# Patient Record
Sex: Female | Born: 1990 | Race: Black or African American | Hispanic: No | Marital: Single | Smoking: Light tobacco smoker
Health system: Southern US, Community
[De-identification: ages and names within clinical notes are randomized; demographics above are authoritative.]

---
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
## 2011-01-06 ENCOUNTER — Inpatient Hospital Stay (INDEPENDENT_AMBULATORY_CARE_PROVIDER_SITE_OTHER)
Admission: RE | Admit: 2011-01-06 | Discharge: 2011-01-06 | Disposition: A | Discharge status: Home / Self Care | Payer: Self-pay | Source: Ambulatory Visit | Attending: Emergency Medicine | Admitting: Emergency Medicine

## 2011-01-06 ENCOUNTER — Ambulatory Visit (INDEPENDENT_AMBULATORY_CARE_PROVIDER_SITE_OTHER): Payer: Self-pay

## 2011-01-06 DIAGNOSIS — S139XXA Sprain of joints and ligaments of unspecified parts of neck, initial encounter: Secondary | ICD-10-CM

## 2012-03-01 ENCOUNTER — Emergency Department: Payer: Self-pay | Admitting: *Deleted

## 2012-03-01 LAB — URINALYSIS, COMPLETE
Bacteria: NONE SEEN
Blood: NEGATIVE
Ketone: NEGATIVE
Protein: NEGATIVE
Specific Gravity: 1.01 (ref 1.003–1.030)
Squamous Epithelial: 1
WBC UR: 10 /HPF (ref 0–5)

## 2012-03-01 LAB — PREGNANCY, URINE: Pregnancy Test, Urine: NEGATIVE m[IU]/mL

## 2012-03-07 ENCOUNTER — Inpatient Hospital Stay: Payer: Self-pay | Admitting: Obstetrics & Gynecology

## 2012-03-07 LAB — URINALYSIS, COMPLETE
Blood: NEGATIVE
Ketone: NEGATIVE
Ph: 5 (ref 4.5–8.0)
Ph: 6 (ref 4.5–8.0)
Protein: NEGATIVE
Protein: NEGATIVE
RBC,UR: 1 /HPF (ref 0–5)

## 2012-03-07 LAB — CBC
HCT: 40.1 % (ref 35.0–47.0)
HGB: 13.4 g/dL (ref 12.0–16.0)
MCH: 31.8 pg (ref 26.0–34.0)
MCV: 95 fL (ref 80–100)
RBC: 4.21 10*6/uL (ref 3.80–5.20)

## 2012-03-07 LAB — COMPREHENSIVE METABOLIC PANEL
Albumin: 4.4 g/dL (ref 3.4–5.0)
Alkaline Phosphatase: 91 U/L (ref 50–136)
BUN: 11 mg/dL (ref 7–18)
Glucose: 90 mg/dL (ref 65–99)
Potassium: 3.8 mmol/L (ref 3.5–5.1)
SGOT(AST): 26 U/L (ref 15–37)
SGPT (ALT): 27 U/L (ref 12–78)
Total Protein: 10.2 g/dL — ABNORMAL HIGH (ref 6.4–8.2)

## 2012-03-07 LAB — WET PREP, GENITAL

## 2012-03-08 LAB — CBC WITH DIFFERENTIAL/PLATELET
Basophil %: 0.3 %
Comment - H1-Com1: NORMAL
Eosinophil %: 0.2 %
HCT: 31 % — ABNORMAL LOW (ref 35.0–47.0)
HGB: 10.7 g/dL — ABNORMAL LOW (ref 12.0–16.0)
Lymphocyte %: 9 %
Lymphocytes: 12 %
MCV: 95 fL (ref 80–100)
Monocytes: 2 %
Platelet: 403 10*3/uL (ref 150–440)
RBC: 3.26 10*6/uL — ABNORMAL LOW (ref 3.80–5.20)
WBC: 23.7 10*3/uL — ABNORMAL HIGH (ref 3.6–11.0)

## 2012-03-09 LAB — CBC WITH DIFFERENTIAL/PLATELET
Bands: 3 %
Basophil #: 0 10*3/uL (ref 0.0–0.1)
Eosinophil #: 0.1 10*3/uL (ref 0.0–0.7)
HCT: 30.8 % — ABNORMAL LOW (ref 35.0–47.0)
HGB: 10.1 g/dL — ABNORMAL LOW (ref 12.0–16.0)
Lymphocyte %: 8.1 %
MCV: 96 fL (ref 80–100)
Monocyte #: 1.3 x10 3/mm — ABNORMAL HIGH (ref 0.2–0.9)
Segmented Neutrophils: 82 %
WBC: 23.8 10*3/uL — ABNORMAL HIGH (ref 3.6–11.0)

## 2012-03-09 LAB — CBC
HGB: 10.8 g/dL — ABNORMAL LOW (ref 12.0–16.0)
Platelet: 443 10*3/uL — ABNORMAL HIGH (ref 150–440)

## 2012-03-10 LAB — COMPREHENSIVE METABOLIC PANEL
BUN: 5 mg/dL — ABNORMAL LOW (ref 7–18)
Chloride: 109 mmol/L — ABNORMAL HIGH (ref 98–107)
Co2: 24 mmol/L (ref 21–32)
Creatinine: 0.73 mg/dL (ref 0.60–1.30)
EGFR (Non-African Amer.): >60
Potassium: 3.4 mmol/L — ABNORMAL LOW (ref 3.5–5.1)
SGPT (ALT): 23 U/L (ref 12–78)

## 2012-03-10 LAB — CBC WITH DIFFERENTIAL/PLATELET
Basophil #: 0.1 10*3/uL (ref 0.0–0.1)
Lymphocyte #: 2.4 10*3/uL (ref 1.0–3.6)
MCV: 96 fL (ref 80–100)
Monocyte %: 4.6 %
Neutrophil %: 83.8 %
Platelet: 416 10*3/uL (ref 150–440)
RBC: 3.13 10*6/uL — ABNORMAL LOW (ref 3.80–5.20)
RDW: 12.5 % (ref 11.5–14.5)
WBC: 22.8 10*3/uL — ABNORMAL HIGH (ref 3.6–11.0)

## 2012-03-10 LAB — APTT: Activated PTT: 50 secs — ABNORMAL HIGH (ref 23.6–35.9)

## 2012-03-10 LAB — PROTIME-INR
INR: 1.1
Prothrombin Time: 14.3 secs (ref 11.5–14.7)

## 2012-03-13 LAB — CULTURE, BLOOD (SINGLE)

## 2014-09-01 NOTE — Consult Note (Signed)
Brief Consult Note: Diagnosis: TOA.   Patient was seen by consultant.   Consult note dictated.   Recommend further assessment or treatment.   Orders entered.   Discussed with Attending MD.   Comments: Will have surgery consult in the event that this represents appendicities as opposed to [facility].  Electronic Signatures: Lorrene ReidStaebler, Doryan Bahl M (MD)  (Signed 24-Oct-13 20:23)  Authored: Brief Consult Note   Last Updated: 24-Oct-13 20:23 by Lorrene ReidStaebler, Tayler Heiden M (MD)

## 2014-09-01 NOTE — Consult Note (Signed)
Consulting Department: Emergency Department Consulting Physician:  Primary Care: [facility]  Consulting Question: Abdominal pain, fever, leukocytosis, adnexal fluid collection  History of Present Illness: Patient is a 24 yo G0 presenting with a 1 week history of abdominal pain and lumbago.  Initially presented to the ER on 03/01/2012 at which time she was diagnosed with muscle strain.  Her symptoms have been gradually worsening.  She has not developed fevers and chills. There is no associated nausea, pain is started as diffuse pelvic pain but now also reports some right upper quadrant pain. No associated nausea or emesis, no constipation, no diarrhea.   Her LMP was 02/24/2012 and lasted 3 days.  She denies a history of sexually transmitted infections, does endorse unprotected intercourse a littel over a month ago.  Some non-purulent white vaginal discharge has been noted by the patient.    Review of Systems: 10 point review of systems negative unless otherwise noted in HPI  Past Medical History: none  Past Surgical History: none  Past Obstetric History: G0  Past Gynecologic History:  no history of STI  or abnormal paps.  Menarche age 24, monthly menses lasting 5-7 days  Family History: Mother with lupus, father with diabetes  Social History: Reports social EtOH use, no tobacco use, no illicit drug use  Allergies: NKDA  Medications: none  Physical Exam: T 101.1, BP 140/98, HR 128, RR 24, O2sat 97% RA General: Visibily is some discomfort HEENT: normocephalic, anicteric Cardivascular: tachycardic, no adventitious heart sounds Pulmonary: CTAB Abdomen: NABS, soft, non-distended, rebound and guarding present, significantly tenderness diffusely throughout abdomen Pelvic: Normal external female genitalia, no purulent vaginal discharge, normal appearing cervix.  Moderate cervical motion tenderness, no adnexal masses apprecaited but exam limited secondary to patient  discomfort. Extremities: No edema Neurologic: grossly intact  Labs: UA: negative Wet Mount:  Large number of leukocytes otherwise negative WBC: 28.8K, H&H 13.4 & 40.1, Platelets 541K Lipase 111 CMP: no abnormalities UPT negative GC/CT and blood cultures pending CT scan loculated fluid collection in the pelvis  Assessment: 24 yo G0 with tubo-ovarian abscess  Plan: - Admit to gynecology service - Appendix not visualized on CT scan but appearance consistent with TOA, surgery following - Start cerftriaxone and and doxycyline - Morphine PCA for pain controll - Repeat CBC tomorrow AM - Follow up on GC/CT cultures  Vena AustriaAndreas Uziel Covault, MD  Electronic Signatures: Lorrene ReidStaebler, Aleenah Homen M (MD)  (Signed on 25-Oct-13 00:58)  Authored  Last Updated: 25-Oct-13 00:58 by Lorrene ReidStaebler, Leanthony Rhett M (MD)

## 2014-09-01 NOTE — Consult Note (Signed)
PATIENT NAME:  Grace Boyd, Grace Boyd MR#:  [id number] DATE OF BIRTH:  [birth year]  DATE OF CONSULTATION:  03/07/2012  REFERRING PHYSICIAN:   CONSULTING PHYSICIAN:  Adah Salvageichard E. Excell Seltzerooper, MD  CHIEF COMPLAINT: Lower abdominal pain.   HISTORY OF PRESENT ILLNESS: This is a patient with approximately two weeks of abdominal pain. It actually started in the left lower quadrant just as she was finishing her menstrual cycle and has gradually worsened, somewhat waxed and waned through the week but in the last few days it'Boyd worsened. She started having low-grade fevers. She has been to the ER once and to Urgent Care as well. She has had some nausea. No vomiting. No chills but she has had low-grade fevers. Has had normal bowel movements. No melena or hematochezia. Her chief complaint was actually that of left lower quadrant pain at the time of the first onset of symptoms.   I was asked to see the patient for a pelvic abscess and she is being admitted by the gynecology service. I spoke directly with Dr. Darnelle CatalanMalinda concerning her care.   PAST MEDICAL HISTORY: None.   PAST SURGICAL HISTORY: None.   ALLERGIES: None.   MEDICATIONS: None.   FAMILY HISTORY: Noncontributory.   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 She stopped smoking three months ago. Drinks alcohol on weekends. She has rare but multiple sexual partners. Most recent sexual contact was approximately four weeks ago, two weeks prior to the onset of these symptoms.   PHYSICAL EXAMINATION:   GENERAL: Healthy appearing black female patient.   VITAL SIGNS: Temperature 99.3, was 101.1, pulse 102, respirations 18, blood pressure 127/70, 99% room air sat. Pain scale 6.   GENERAL: The patient has her legs drawn up into a semi Fowler-type position.   HEENT: No scleral icterus.   NECK: No palpable neck nodes.   CHEST: Clear to auscultation.   CARDIAC: Regular rate and rhythm.   ABDOMEN: Abdomen is showing guarding in both lower  quadrants with percussion tenderness and rebound tenderness. No mass palpable. More tender in the left lower quadrant than right lower quadrant.   EXTREMITIES: Without edema. Calves are nontender.   NEUROLOGIC: Grossly intact.   INTEGUMENTARY: No jaundice.   LABORATORY, DIAGNOSTIC, AND RADIOLOGICAL DATA: White blood cell count 28.8, platelet count 541. Electrolytes are within normal limits.   CT scan is personally reviewed demonstrating a pelvic abscess. The appendix is not identified but there is no stranding or inflammatory process suggested in the pericecal area.   ASSESSMENT AND PLAN: Doubt that this is appendicitis, fairly classic presentation for PID and TOA. I suspect that that is the etiology of her pain and this pelvic abscess.   Management of the pelvic abscess will be left up to the GYN service whether it is drained percutaneously or laparoscopically and I would certainly offer for either myself or Dr. Egbert GaribaldiBird to be available if and when she were to go to the operating room as an assistant if necessary.    If this turns out to be something other than TOA or PID such as appendicitis, we would certainly be happy to be available and will follow the patient while she is in the hospital. She has already been started on antibiotics. I discussed this case with Dr. Darnelle CatalanMalinda.   ____________________________ Adah Salvageichard E. Excell Seltzerooper, MD rec:drc D: 03/07/2012 21:05:34 ET T: 03/08/2012 09:09:29 ET JOB#: [id number]  cc: Adah Salvageichard E. Excell Seltzerooper, MD, <Dictator> Lattie HawICHARD E Joseff Luckman MD ELECTRONICALLY SIGNED 03/15/2012 19:47

## 2014-09-01 NOTE — Consult Note (Signed)
Brief Consult Note: Diagnosis: PID/TOA.   Patient was seen by consultant.   Consult note dictated.   Recommend further assessment or treatment.   Discussed with Attending MD.   Comments: Likely PID/TOA. will be available to assist with care if drainage or laparoscopy is indicated by GYN admitting team. discussed with Dr Darnelle CatalanMalinda.  Electronic Signatures: Lattie Hawooper, Oberon Hehir E (MD)  (Signed 24-Oct-13 20:54)  Authored: Brief Consult Note   Last Updated: 24-Oct-13 20:54 by Lattie Hawooper, Nimesh Riolo E (MD)

## 2014-12-18 ENCOUNTER — Encounter: Payer: Self-pay | Admitting: Gynecology

## 2014-12-18 ENCOUNTER — Ambulatory Visit: Payer: Worker's Compensation

## 2014-12-18 ENCOUNTER — Ambulatory Visit
Admission: EM | Admit: 2014-12-18 | Discharge: 2014-12-18 | Disposition: A | Discharge status: Home / Self Care | Payer: Worker's Compensation | Attending: Family Medicine | Admitting: Family Medicine

## 2014-12-18 ENCOUNTER — Telehealth: Payer: Self-pay

## 2014-12-18 DIAGNOSIS — Z79899 Other long term (current) drug therapy: Secondary | ICD-10-CM | POA: Insufficient documentation

## 2014-12-18 DIAGNOSIS — F17210 Nicotine dependence, cigarettes, uncomplicated: Secondary | ICD-10-CM | POA: Diagnosis not present

## 2014-12-18 DIAGNOSIS — X58XXXA Exposure to other specified factors, initial encounter: Secondary | ICD-10-CM | POA: Insufficient documentation

## 2014-12-18 DIAGNOSIS — S63501A Unspecified sprain of right wrist, initial encounter: Secondary | ICD-10-CM | POA: Diagnosis not present

## 2014-12-18 DIAGNOSIS — S6991XA Unspecified injury of right wrist, hand and finger(s), initial encounter: Secondary | ICD-10-CM | POA: Diagnosis present

## 2014-12-18 MED ORDER — HYDROCODONE-ACETAMINOPHEN 5-325 MG PO TABS: 1.0000 | ORAL_TABLET | Freq: Four times a day (QID) | ORAL | Status: AC | PRN

## 2014-12-18 NOTE — ED Provider Notes (Signed)
CSN: [id number]     Arrival date & time 12/18/14  1723 History   First MD Initiated Contact with Patient 12/18/14 1827     Chief Complaint  Patient presents with  . Wrist Injury   (Consider location/radiation/quality/duration/timing/severity/associated sxs/prior Treatment) HPI Comments: 24 yo female with a complaint of right wrist pain after injury at work yesterday around 5pm.  States was lifting a box with her right hand when she felt sudden onset of pain. Pain has continued and has also noticed some mild swelling.  Denies any numbness or tingling.   The history is provided by the patient.    History reviewed. No pertinent past medical history. History reviewed. No pertinent past surgical history. No family history on file. History  Substance Use Topics  . Smoking status: Light Tobacco Smoker  . Smokeless tobacco: Not on file  . Alcohol Use: Yes   OB History    No data available     Review of Systems  Allergies  Review of patient's allergies indicates no known allergies.  Home Medications   Prior to Admission medications   Medication Sig Start Date End Date Taking? Authorizing Provider  HYDROcodone-acetaminophen (NORCO/VICODIN) 5-325 MG per tablet Take 1-2 tablets by mouth every 6 (six) hours as needed. 12/18/14   Payton Mccallum, MD   BP 124/83 mmHg  Pulse 74  Temp(Src) 98 F (36.7 C) (Oral)  Resp 18  Ht  (1.702 m)  Wt 182 lb (82.555 kg)  BMI 28.50 kg/m2  SpO2 100%  LMP 11/17/2014 (Approximate) Physical Exam  Constitutional: She appears well-developed and well-nourished. No distress.  Musculoskeletal:       Right wrist: She exhibits decreased range of motion (secondary to pain), tenderness, bony tenderness and swelling (mild). She exhibits no effusion, no crepitus, no deformity and no laceration.  Skin: Skin is warm and dry. She is not diaphoretic.  Nursing note and vitals reviewed.   ED Course  Procedures (including critical care time) Labs Review Labs  Reviewed - No data to display  Imaging Review Dg Wrist Complete Right  12/18/2014   CLINICAL DATA:  Lifted a heavy box at work yesterday and injured wrist.  EXAM: RIGHT WRIST - COMPLETE 3+ VIEW  COMPARISON:  None.  FINDINGS: The joint spaces are maintained. No acute bony findings or abnormal soft tissue calcifications.  IMPRESSION: Normal right wrist radiographs.   Electronically Signed   By: Rudie Meyer M.D.   On: 12/18/2014 18:52     MDM   1. Right wrist sprain, initial encounter    Discharge Medication List as of 12/18/2014  7:20 PM    START taking these medications   Details  HYDROcodone-acetaminophen (NORCO/VICODIN) 5-325 MG per tablet Take 1-2 tablets by mouth every 6 (six) hours as needed., Starting 12/18/2014, Until Discontinued, Print      Plan: 1. x-ray results and diagnosis reviewed with patient 2. rx as per orders; risks, benefits, potential side effects reviewed with patient 3. Recommend supportive treatment with rest, ice, work restrictions as per work note 4. Patient given velcro wrist splint in clinic 5. F/u in 1 week or prn if symptoms worsen or don't improve    Payton Mccallum, MD 12/18/14 [phone]

## 2014-12-18 NOTE — ED Notes (Signed)
Call to patient, left message regarding prescription for Hydrocodone was left behind. Ask that she come back by and get prescription as we can not call it in. Prescription locked in [facility].

## 2014-12-18 NOTE — ED Notes (Signed)
Patient c/o right wrist injury at work yesterday at work. Per patient lifting a box with one hand.

## 2015-01-06 ENCOUNTER — Ambulatory Visit
Admission: EM | Admit: 2015-01-06 | Discharge: 2015-01-06 | Disposition: A | Discharge status: Home / Self Care | Payer: Worker's Compensation | Attending: Family Medicine | Admitting: Family Medicine

## 2015-01-06 DIAGNOSIS — S63501D Unspecified sprain of right wrist, subsequent encounter: Secondary | ICD-10-CM | POA: Diagnosis not present

## 2015-01-06 NOTE — ED Provider Notes (Signed)
CSN: [id number]     Arrival date & time 01/06/15  1711 History   First MD Initiated Contact with Patient 01/06/15 1753     Chief Complaint  Patient presents with  . Follow-up   (Consider location/radiation/quality/duration/timing/severity/associated sxs/prior Treatment) HPI Comments: 24 yo female with a h/o a right wrist sprain work injury on 12/17/14 here for follow up. States she doing better and symptoms resolved. States she's ready to return to work full duty. Denies any symptoms.   The history is provided by the patient.    History reviewed. No pertinent past medical history. History reviewed. No pertinent past surgical history. Family History  Problem Relation Age of Onset  . Lupus Mother    Social History  Substance Use Topics  . Smoking status: Light Tobacco Smoker  . Smokeless tobacco: None  . Alcohol Use: Yes   OB History    No data available     Review of Systems  Allergies  Review of patient's allergies indicates no known allergies.  Home Medications   Prior to Admission medications   Medication Sig Start Date End Date Taking? Authorizing Provider  HYDROcodone-acetaminophen (NORCO/VICODIN) 5-325 MG per tablet Take 1-2 tablets by mouth every 6 (six) hours as needed. 12/18/14  Yes Payton Mccallum, MD   BP 136/93 mmHg  Pulse 78  Temp(Src) 98.1 F (36.7 C) (Oral)  Resp 16  Ht  (1.702 m)  Wt 180 lb (81.647 kg)  BMI 28.19 kg/m2  SpO2 100%  LMP 12/06/2014 (Exact Date) Physical Exam  Constitutional: She appears well-developed and well-nourished. No distress.  Musculoskeletal: She exhibits no edema or tenderness.       Right wrist: Normal.  Skin: She is not diaphoretic.  Nursing note and vitals reviewed.   ED Course  Procedures (including critical care time) Labs Review Labs Reviewed - No data to display  Imaging Review No results found.   MDM   1. Wrist sprain, right, subsequent encounter    Plan: 1. diagnosis reviewed with patient 2.  Patient's condition resolved; patient medically stationary; release to return to work full duty, without restrictions 3. F/u prn     Payton Mccallum, MD 01/06/15 [phone]

## 2015-01-06 NOTE — ED Notes (Signed)
Injured right wrist at work at [employer] on 12/17/14. Seen here on 12/17/13. Given wrist splint, letter for "light duty" and instructed to return August 12/16. States was waiting for HR to call her, and didn't receive call until August 19th. Took the following week off to rest the wrist. Have 2nd job that required use of wrist and occasionally had to remove the splint for comfort. Informed on August 19th by HR at [place], that needed a note by the end of this current week to return to work at [employer].

## 2015-05-18 ENCOUNTER — Emergency Department
Admission: EM | Admit: 2015-05-18 | Discharge: 2015-05-18 | Disposition: A | Discharge status: Home / Self Care | Payer: Self-pay | Attending: Emergency Medicine | Admitting: Emergency Medicine

## 2015-05-18 DIAGNOSIS — F17200 Nicotine dependence, unspecified, uncomplicated: Secondary | ICD-10-CM | POA: Insufficient documentation

## 2015-05-18 DIAGNOSIS — K0381 Cracked tooth: Secondary | ICD-10-CM | POA: Insufficient documentation

## 2015-05-18 DIAGNOSIS — R030 Elevated blood-pressure reading, without diagnosis of hypertension: Secondary | ICD-10-CM | POA: Insufficient documentation

## 2015-05-18 DIAGNOSIS — K029 Dental caries, unspecified: Secondary | ICD-10-CM | POA: Insufficient documentation

## 2015-05-18 DIAGNOSIS — K0889 Other specified disorders of teeth and supporting structures: Secondary | ICD-10-CM | POA: Insufficient documentation

## 2015-05-18 MED ORDER — IBUPROFEN 800 MG PO TABS
800.0000 mg | ORAL_TABLET | Freq: Once | ORAL | Status: AC
  Administered 2015-05-18: 800 mg via ORAL
  Filled 2015-05-18: qty 1

## 2015-05-18 MED ORDER — AMOXICILLIN 500 MG PO CAPS: 500.0000 mg | ORAL_CAPSULE | Freq: Three times a day (TID) | ORAL | Status: DC

## 2015-05-18 MED ORDER — TRAMADOL HCL 50 MG PO TABS: 50.0000 mg | ORAL_TABLET | Freq: Four times a day (QID) | ORAL | Status: AC | PRN

## 2015-05-18 MED ORDER — IBUPROFEN 800 MG PO TABS: 800.0000 mg | ORAL_TABLET | Freq: Three times a day (TID) | ORAL | Status: AC | PRN

## 2015-05-18 MED ORDER — LIDOCAINE VISCOUS 2 % MT SOLN
15.0000 mL | Freq: Once | OROMUCOSAL | Status: AC
  Administered 2015-05-18: 15 mL via OROMUCOSAL
  Filled 2015-05-18: qty 15

## 2015-05-18 MED ORDER — TRAMADOL HCL 50 MG PO TABS
50.0000 mg | ORAL_TABLET | Freq: Once | ORAL | Status: AC
  Administered 2015-05-18: 50 mg via ORAL
  Filled 2015-05-18: qty 1

## 2015-05-18 NOTE — Discharge Instructions (Signed)
Follow up from the list of dental clinics. OPTIONS FOR DENTAL FOLLOW UP CARE  [place] Department of Health and Human Services - Local Safety Net Dental Clinics TripDoors.comhttp://www.ncdhhs.gov/dph/oralhealth/services/safetynetclinics.htm   [facility] [phone])  [facility] [phone])  [facility] [phone]  [facility] [phone])  [facility] [phone]) This clinic caters to the indigent population and is on a lottery system. Location: [facility] [address], [address], [place] [facility] Hours: Wednesdays from 6pm - 9pm, patients seen by a lottery system. For dates, call or go to [email or web address] Services: Cleanings, fillings and simple extractions. Payment Options: DENTAL WORK IS FREE OF CHARGE. Bring proof of income or support. Best way to get seen: Arrive at 5:15 pm - this is a lottery, NOT first come/first serve, so arriving earlier will not increase your chances of being seen.     [facility] [phone] Select option 1 for emergencies   Location: [facility] [address], [address], [place] [facility] Hours: No walk-ins accepted - call the day before to schedule an appointment. Check in times are 9:30 am and 1:30 pm. Services: Simple extractions, temporary fillings, pulpectomy/pulp debridement, uncomplicated abscess drainage. Payment Options: PAYMENT IS DUE AT THE TIME OF SERVICE.  Fee is usually $100-200, additional surgical procedures (e.g. abscess drainage) may be extra. Cash, checks, Visa/MasterCard accepted.  Can file Medicaid if patient is covered for dental - patient should call case worker to check. No discount for [facility] patients. Best way to get seen: MUST call the day before and get onto the schedule. Can usually be seen the next 1-2 days. No walk-ins accepted.     [facility] [phone]   Location: [facility], [address], [place] [facility] Hours: M, W, Th, F 8am or 1:30pm, Tues 9a or 1:30 - first come/first served. Services: Simple extractions, temporary fillings, uncomplicated abscess drainage.  You do not need to be an [place] resident. Payment Options: PAYMENT IS DUE AT THE TIME OF SERVICE. Dental insurance, otherwise sliding scale - bring proof of income or support. Depending on income and treatment needed, cost is usually $50-200. Best way to get seen: Arrive early as it is first come/first served.     [facility] [phone]   Location: [address] Clinic Hours: Mon-Thu 8a-5p Services: Most basic dental services including extractions and fillings. Payment Options: PAYMENT IS DUE AT THE TIME OF SERVICE. Sliding scale, up to 50% off - bring proof if income or support. Medicaid with dental option accepted. Best way to get seen: Call to schedule an appointment, can usually be seen within 2 weeks OR they will try to see walk-ins - show up at 8a or 2p (you may have to wait).     [facility] [phone] [place] RESIDENTS ONLY   Location: [facility], [address], [place], [place] [id number] Clinic Hours: By appointment only. Monday - Thursday 8am-5pm, Friday 8am-12pm Services: Cleanings, fillings, extractions. Payment Options: PAYMENT IS DUE AT THE TIME OF SERVICE. Cash, Visa or MasterCard. Sliding scale - $30 minimum per service. Best way to get seen: Come in to office, complete packet and make an appointment - need proof of income or support monies for each household member and proof of [place] residence. Usually takes about a month to get in.     [facility] °[phone] °  °Location: °[address] [place] °Clinic Hours: Walk-in Urgent Care Dental Services  are offered Monday-Friday mornings only. °The numbers of emergencies accepted daily is limited to the number of °providers available. °Maximum 15 - Mondays, Wednesdays & Thursdays °Maximum 10 - Tuesdays & Fridays °Services: °You do not need to be a [place] resident to be seen for a dental emergency. °Emergencies are defined as pain, swelling, abnormal bleeding, or dental trauma. Walkins will receive x-rays if needed. °NOTE: Dental cleaning is not an emergency. °Payment Options: °PAYMENT IS DUE AT THE TIME OF SERVICE. °Minimum co-pay is $40.00 for uninsured patients. °Minimum co-pay is $3.00 for Medicaid with dental coverage. °Dental Insurance is accepted and must be presented at time of visit. °Medicare does not cover dental. °Forms of payment: Cash, credit card, checks. °Best way to get seen: °If not previously registered with the clinic, walk-in dental registration begins at 7:15 am and is on a first come/first serve basis. °If previously registered with the clinic, call to make an appointment. °  °  °[facility] °[phone] °[place] RESIDENTS ONLY °  °Location: °[address], [place], [place] °Clinic Hours: °Mon-Thu 10a-2p °Services: Extractions only! °Payment Options: °FREE (donations accepted) - bring proof of income or support °Best way to get seen: °Call and schedule an appointment OR come at 8am on the 1st Monday of every month (except for holidays) when it is first come/first served. °  °  °[facility] °[phone] °  °Location: °[address], [place] °Clinic Hours: °Friday mornings °Services, Payment Options, Best way to get seen: °Call for info ° °

## 2015-05-18 NOTE — ED Notes (Signed)
Pt reports to ED w/ dental pain that began yesterday.  Pt sts that it is painful to talk.

## 2015-05-18 NOTE — ED Provider Notes (Signed)
[facility] Emergency Department Provider Note  ____________________________________________  Time seen: Approximately 9:48 PM  I have reviewed the triage vital signs and the nursing notes.   HISTORY  Chief Complaint Dental Pain    HPI Grace Boyd is a 25 y.o. female patient complain of dental pain for 2 days. Patient states she has shows with follow-up with dentist as soon as she can get an appointment. No palliative measures taken for this complaint. She rated the pain as a 10 over 10 describe the pain as sharp.   History reviewed. No pertinent past medical history.  There are no active problems to display for this patient.   History reviewed. No pertinent past surgical history.  Current Outpatient Rx  Name  Route  Sig  Dispense  Refill  . amoxicillin (AMOXIL) 500 MG capsule   Oral   Take 1 capsule (500 mg total) by mouth 3 (three) times daily.   30 capsule   0   . HYDROcodone-acetaminophen (NORCO/VICODIN) 5-325 MG per tablet   Oral   Take 1-2 tablets by mouth every 6 (six) hours as needed.   10 tablet   0   . ibuprofen (ADVIL,MOTRIN) 800 MG tablet   Oral   Take 1 tablet (800 mg total) by mouth every 8 (eight) hours as needed.   30 tablet   0   . traMADol (ULTRAM) 50 MG tablet   Oral   Take 1 tablet (50 mg total) by mouth every 6 (six) hours as needed.   20 tablet   0     Allergies Review of patient's allergies indicates no known allergies.  Family History  Problem Relation Age of Onset  . Lupus Mother     Social History Social History  Substance Use Topics  . Smoking status: Light Tobacco Smoker  . Smokeless tobacco: None  . Alcohol Use: Yes    Review of Systems Constitutional: No fever/chills Eyes: No visual changes. ENT: No sore throat. Dental pain Cardiovascular: Denies chest pain. Respiratory: Denies shortness of breath. Gastrointestinal: No abdominal pain.  No nausea, no vomiting.  No diarrhea.  No  constipation. Genitourinary: Negative for dysuria. Musculoskeletal: Negative for back pain. Skin: Negative for rash. Neurological: Negative for headaches, focal weakness or numbness. 10-point ROS otherwise negative.  ____________________________________________   PHYSICAL EXAM:  VITAL SIGNS: ED Triage Vitals  Enc Vitals Group     BP 05/18/15 2102 154/88 mmHg     Pulse Rate 05/18/15 2102 88     Resp 05/18/15 2102 16     Temp 05/18/15 2102 98.3 F (36.8 C)     Temp Source 05/18/15 2102 Oral     SpO2 05/18/15 2102 99 %     Weight --      Height --      Head Cir --      Peak Flow --      Pain Score 05/18/15 2102 10     Pain Loc --      Pain Edu? --      Excl. in GC? --     Constitutional: Alert and oriented. Well appearing and in no acute distress. Eyes: Conjunctivae are normal. PERRL. EOMI. Head: Atraumatic. Nose: No congestion/rhinnorhea. Mouth/Throat: Mucous membranes are moist.  Oropharynx non-erythematous. Devitalize tooth #31. Neck: No stridor.  No cervical spine tenderness to palpation. Hematological/Lymphatic/Immunilogical: No cervical lymphadenopathy. Cardiovascular: Normal rate, regular rhythm. Grossly normal heart sounds.  Good peripheral circulation. Elevated blood pressure Respiratory: Normal respiratory effort.  No retractions.  Lungs CTAB. Gastrointestinal: Soft and nontender. No distention. No abdominal bruits. No CVA tenderness. Musculoskeletal: No lower extremity tenderness nor edema.  No joint effusions. Neurologic:  Normal speech and language. No gross focal neurologic deficits are appreciated. No gait instability. Skin:  Skin is warm, dry and intact. No rash noted. Psychiatric: Mood and affect are normal. Speech and behavior are normal.  ____________________________________________   LABS (all labs ordered are listed, but only abnormal results are displayed)  Labs Reviewed - No data to  display ____________________________________________  EKG   ____________________________________________  RADIOLOGY   ____________________________________________   PROCEDURES  Procedure(s) performed: None  Critical Care performed: No  ____________________________________________   INITIAL IMPRESSION / ASSESSMENT AND PLAN / ED COURSE  Pertinent labs & imaging results that were available during my care of the patient were reviewed by me and considered in my medical decision making (see chart for details).  Dental pain. Patient provided a list of dental clinics to follow-up for continued care. Patient given a prescription for tramadol, amoxicillin, ibuprofen. ____________________________________________   FINAL CLINICAL IMPRESSION(S) / ED DIAGNOSES  Final diagnoses:  Pain due to dental caries      Joni Reining, PA-C 05/18/15 2159  Darien Ramus, MD 05/18/15 2303

## 2016-03-03 ENCOUNTER — Other Ambulatory Visit: Payer: Self-pay | Admitting: Family Medicine

## 2016-03-03 DIAGNOSIS — M5412 Radiculopathy, cervical region: Secondary | ICD-10-CM

## 2016-03-05 ENCOUNTER — Ambulatory Visit
Admission: RE | Admit: 2016-03-05 | Discharge: 2016-03-05 | Disposition: A | Discharge status: Home / Self Care | Payer: Worker's Compensation | Source: Ambulatory Visit | Attending: Family Medicine | Admitting: Family Medicine

## 2016-03-05 DIAGNOSIS — M5412 Radiculopathy, cervical region: Secondary | ICD-10-CM

## 2016-06-23 ENCOUNTER — Encounter: Payer: Self-pay | Admitting: Emergency Medicine

## 2016-06-23 ENCOUNTER — Emergency Department: Payer: BLUE CROSS/BLUE SHIELD

## 2016-06-23 ENCOUNTER — Emergency Department
Admission: EM | Admit: 2016-06-23 | Discharge: 2016-06-23 | Disposition: A | Discharge status: Home / Self Care | Payer: BLUE CROSS/BLUE SHIELD | Attending: Emergency Medicine | Admitting: Emergency Medicine

## 2016-06-23 DIAGNOSIS — N939 Abnormal uterine and vaginal bleeding, unspecified: Secondary | ICD-10-CM | POA: Insufficient documentation

## 2016-06-23 DIAGNOSIS — F17200 Nicotine dependence, unspecified, uncomplicated: Secondary | ICD-10-CM | POA: Insufficient documentation

## 2016-06-23 LAB — CBC WITH DIFFERENTIAL/PLATELET
BASOS PCT: 0 %
Basophils Absolute: 0 10*3/uL (ref 0–0.1)
EOS ABS: 0.2 10*3/uL (ref 0–0.7)
Eosinophils Relative: 2 %
HCT: 35.2 % (ref 35.0–47.0)
HEMOGLOBIN: 12.3 g/dL (ref 12.0–16.0)
Lymphocytes Relative: 30 %
Lymphs Abs: 3.5 10*3/uL (ref 1.0–3.6)
MCH: 32 pg (ref 26.0–34.0)
MCHC: 35 g/dL (ref 32.0–36.0)
MCV: 91.3 fL (ref 80.0–100.0)
MONOS PCT: 8 %
Monocytes Absolute: 0.9 10*3/uL (ref 0.2–0.9)
NEUTROS PCT: 60 %
Neutro Abs: 6.8 10*3/uL — ABNORMAL HIGH (ref 1.4–6.5)
Platelets: 328 10*3/uL (ref 150–440)
RBC: 3.85 MIL/uL (ref 3.80–5.20)
RDW: 13.2 % (ref 11.5–14.5)
WBC: 11.5 10*3/uL — AB (ref 3.6–11.0)

## 2016-06-23 LAB — BASIC METABOLIC PANEL
ANION GAP: 7 (ref 5–15)
BUN: 19 mg/dL (ref 6–20)
CALCIUM: 9.1 mg/dL (ref 8.9–10.3)
CHLORIDE: 108 mmol/L (ref 101–111)
CO2: 23 mmol/L (ref 22–32)
CREATININE: 0.54 mg/dL (ref 0.44–1.00)
GFR calc Af Amer: >60 mL/min (ref >60)
GFR calc non Af Amer: >60 mL/min (ref >60)
Glucose, Bld: 100 mg/dL — ABNORMAL HIGH (ref 65–99)
Potassium: 4.1 mmol/L (ref 3.5–5.1)
SODIUM: 138 mmol/L (ref 135–145)

## 2016-06-23 LAB — URINALYSIS, COMPLETE (UACMP) WITH MICROSCOPIC
Bilirubin Urine: NEGATIVE
GLUCOSE, UA: NEGATIVE mg/dL
Ketones, ur: NEGATIVE mg/dL
Leukocytes, UA: NEGATIVE
NITRITE: NEGATIVE
Protein, ur: NEGATIVE mg/dL
SPECIFIC GRAVITY, URINE: 1.019 (ref 1.005–1.030)
pH: 5 (ref 5.0–8.0)

## 2016-06-23 LAB — POC URINE PREG, ED: Preg Test, Ur: NEGATIVE

## 2016-06-23 MED ORDER — NORGESTREL-ETHINYL ESTRADIOL 0.3-30 MG-MCG PO TABS: 1.0000 | ORAL_TABLET | Freq: Every day | ORAL | 11 refills | Status: AC

## 2016-06-23 NOTE — ED Notes (Signed)
ED Provider at bedside. 

## 2016-06-23 NOTE — ED Triage Notes (Signed)
Presents today with c/o vaginal bleeding.  States menstrual cycle started Christmas Eve and bleeding persists.  States has been seen by OBGYN for same.  Had previous episode of irregular menstrual bleeding in June.

## 2016-06-23 NOTE — ED Provider Notes (Signed)
[facility] Emergency Department Provider Note        Time seen: ----------------------------------------- 8:12 AM on 06/23/2016 -----------------------------------------    I have reviewed the triage vital signs and the nursing notes.   HISTORY  Chief Complaint Vaginal Bleeding    HPI Grace Boyd is a 26 y.o. female who presents to ER for vaginal bleeding. Patient states she's had vaginal bleeding almost persistently since Christmas Eve. Patient had this happen last year and was placed on a short course of medication which she is unsure the name that helped control bleeding. She was seen by OB/GYN for similar. She denies any specific pain, has not concerned she is pregnant or that she has an STD. She denies fevers, chills or other complaints. She does feel generally weak.   History reviewed. No pertinent past medical history.  There are no active problems to display for this patient.   History reviewed. No pertinent surgical history.  Allergies Patient has no known allergies.  Social History Social History  Substance Use Topics  . Smoking status: Light Tobacco Smoker  . Smokeless tobacco: Never Used  . Alcohol use Yes    Review of Systems Constitutional: Negative for fever. Cardiovascular: Negative for chest pain. Respiratory: Negative for shortness of breath. Gastrointestinal: Negative for abdominal pain, vomiting and diarrhea. Genitourinary: Negative for dysuria.Positive for vaginal bleeding Musculoskeletal: Negative for back pain. Skin: Negative for rash. Neurological: Positive for generalized weakness  10-point ROS otherwise negative.  ____________________________________________   PHYSICAL EXAM:  VITAL SIGNS: ED Triage Vitals  Enc Vitals Group     BP 06/23/16 0809 (!) 143/96     Pulse Rate 06/23/16 0809 86     Resp 06/23/16 0809 16     Temp 06/23/16 0809 98.2 F (36.8 C)     Temp Source 06/23/16 0809 Oral      SpO2 06/23/16 0809 98 %     Weight 06/23/16 0807 200 lb (90.7 kg)     Height 06/23/16 0807 5\' 7"  (1.702 m)     Head Circumference --      Peak Flow --      Pain Score 06/23/16 0808 0     Pain Loc --      Pain Edu? --      Excl. in GC? --     Constitutional: Alert and oriented. Well appearing and in no distress. Eyes: Conjunctivae are normal. Normal extraocular movements. ENT   Head: Normocephalic and atraumatic.   Nose: No congestion/rhinnorhea.   Mouth/Throat: Mucous membranes are moist.   Neck: No stridor. Cardiovascular: Normal rate, regular rhythm. No murmurs, rubs, or gallops. Respiratory: Normal respiratory effort without tachypnea nor retractions. Breath sounds are clear and equal bilaterally. No wheezes/rales/rhonchi. Gastrointestinal: Soft and nontender. Normal bowel sounds Musculoskeletal: Nontender with normal range of motion in all extremities. No lower extremity tenderness nor edema. Neurologic:  Normal speech and language. No gross focal neurologic deficits are appreciated.  Skin:  Skin is warm, dry and intact. No rash noted. Psychiatric: Mood and affect are normal. Speech and behavior are normal.  ____________________________________________  ED COURSE:  Pertinent labs & imaging results that were available during my care of the patient were reviewed by me and considered in my medical decision making (see chart for details). Patient presents to ER with 2 months of vaginal bleeding. We will assess with labs and consider ultrasound.   Procedures ____________________________________________   LABS (pertinent positives/negatives)  Labs Reviewed  CBC WITH DIFFERENTIAL/PLATELET - Abnormal; Notable for  the following:       Result Value   WBC 11.5 (*)    Neutro Abs 6.8 (*)    All other components within normal limits  BASIC METABOLIC PANEL - Abnormal; Notable for the following:    Glucose, Bld 100 (*)    All other components within normal limits   URINALYSIS, COMPLETE (UACMP) WITH MICROSCOPIC - Abnormal; Notable for the following:    Color, Urine YELLOW (*)    APPearance HAZY (*)    Hgb urine dipstick LARGE (*)    Bacteria, UA RARE (*)    Squamous Epithelial / LPF 6-30 (*)    All other components within normal limits  POC URINE PREG, ED    RADIOLOGY Images were viewed by me  Pelvic ultrasound IMPRESSION: No acute or focal abnormality is identified. Endometrial stripe thickness is 15 mm. Upper limit of normal for a premenopausal patient with abnormal uterine bleeding is 16 mm. ____________________________________________  FINAL ASSESSMENT AND PLAN  Abnormal vaginal bleeding  Plan: Patient with labs and imaging as dictated above. Patient's in no acute distress, I will offer birth control to help regulate her menstrual cycles. She is not having heavy bleeding at this time and her labs are unremarkable so I don't think Provera would be of much benefit at this point. She is stable for outpatient GYN referral.   Emily FilbertWilliams, Jonathan E, MD   Note: This note was generated in part or whole with voice recognition software. Voice recognition is usually quite accurate but there are transcription errors that can and very often do occur. I apologize for any typographical errors that were not detected and corrected.     Emily FilbertJonathan E Williams, MD 06/23/16 [phone]

## 2016-06-23 NOTE — ED Notes (Signed)
Patient ambulated to Ultrasound. Refused wheelchair

## 2016-07-13 ENCOUNTER — Encounter: Payer: Self-pay | Admitting: Emergency Medicine

## 2016-07-13 ENCOUNTER — Emergency Department
Admission: EM | Admit: 2016-07-13 | Discharge: 2016-07-13 | Disposition: A | Discharge status: Home / Self Care | Payer: BLUE CROSS/BLUE SHIELD | Attending: Emergency Medicine | Admitting: Emergency Medicine

## 2016-07-13 DIAGNOSIS — B9689 Other specified bacterial agents as the cause of diseases classified elsewhere: Secondary | ICD-10-CM

## 2016-07-13 DIAGNOSIS — N938 Other specified abnormal uterine and vaginal bleeding: Secondary | ICD-10-CM

## 2016-07-13 DIAGNOSIS — F17200 Nicotine dependence, unspecified, uncomplicated: Secondary | ICD-10-CM | POA: Insufficient documentation

## 2016-07-13 DIAGNOSIS — N760 Acute vaginitis: Secondary | ICD-10-CM | POA: Diagnosis not present

## 2016-07-13 DIAGNOSIS — N9489 Other specified conditions associated with female genital organs and menstrual cycle: Secondary | ICD-10-CM | POA: Diagnosis not present

## 2016-07-13 LAB — BASIC METABOLIC PANEL
Anion gap: 7 (ref 5–15)
BUN: 13 mg/dL (ref 6–20)
CHLORIDE: 108 mmol/L (ref 101–111)
CO2: 24 mmol/L (ref 22–32)
CREATININE: 0.79 mg/dL (ref 0.44–1.00)
Calcium: 9.1 mg/dL (ref 8.9–10.3)
GFR calc Af Amer: >60 mL/min (ref >60)
GFR calc non Af Amer: >60 mL/min (ref >60)
Glucose, Bld: 102 mg/dL — ABNORMAL HIGH (ref 65–99)
Potassium: 4 mmol/L (ref 3.5–5.1)
SODIUM: 139 mmol/L (ref 135–145)

## 2016-07-13 LAB — CBC
HCT: 36.7 % (ref 35.0–47.0)
HEMOGLOBIN: 12.5 g/dL (ref 12.0–16.0)
MCH: 31.8 pg (ref 26.0–34.0)
MCHC: 34.1 g/dL (ref 32.0–36.0)
MCV: 93.3 fL (ref 80.0–100.0)
PLATELETS: 373 10*3/uL (ref 150–440)
RBC: 3.93 MIL/uL (ref 3.80–5.20)
RDW: 13.5 % (ref 11.5–14.5)
WBC: 9.5 10*3/uL (ref 3.6–11.0)

## 2016-07-13 LAB — HCG, QUANTITATIVE, PREGNANCY: hCG, Beta Chain, Quant, S: <1 m[IU]/mL (ref <5)

## 2016-07-13 LAB — TYPE AND SCREEN
ABO/RH(D): A POS
Antibody Screen: NEGATIVE

## 2016-07-13 LAB — CHLAMYDIA/NGC RT PCR (ARMC ONLY)
CHLAMYDIA TR: NOT DETECTED
N gonorrhoeae: NOT DETECTED

## 2016-07-13 LAB — WET PREP, GENITAL
Sperm: NONE SEEN
Trich, Wet Prep: NONE SEEN
Yeast Wet Prep HPF POC: NONE SEEN

## 2016-07-13 MED ORDER — METRONIDAZOLE 500 MG PO TABS: 500.0000 mg | ORAL_TABLET | Freq: Two times a day (BID) | ORAL | 0 refills | Status: AC

## 2016-07-13 NOTE — Discharge Instructions (Signed)
Please follow up closely with obstetrics and gynecology or your primary doctor.  Return to the emergency room if your bleeding worsens, you become weak and dizzy or lightheaded, you have an episode of passing out, develop severe bleeding such as more than 1 soaked pad per hour for more than 3 straight hours, develop abdominal or pelvic pain, fevers chills or other new concerns arise.   

## 2016-07-13 NOTE — ED Triage Notes (Signed)
Pt reports intermittent period x4 months, was seen here and put on birth control. Pt reports intermittent heavier bleeding since being on the birth control, Pt reports using 2 pads in one hour. Reports bright red vaginal bleeding. Pt did not follow up with OBGYN, reports she "needed to be referred by the ER".

## 2016-07-13 NOTE — ED Provider Notes (Signed)
[facility] Emergency Department Provider Note   ____________________________________________   First MD Initiated Contact with Patient 07/13/16 1201     (approximate)  I have reviewed the triage vital signs and the nursing notes.   HISTORY  Chief Complaint Vaginal Bleeding    HPI GEOFFREY MANKIN is a 26 y.o. female here for evaluation of vaginal bleeding. Patient reports that since October she's had episodes where she will have vaginal bleeding for up to a month, sometimes spotting, sometimes passing clots. She was seen here about 3 weeks ago and placed on birth control, she reports she has not seen any improvement, and today she has used about 2-3 pads since this morning. She is on iron supplements as well, and attempted to set up a follow-up with gynecology but reports the office told her she needed a follow-up visit with the referring emergency room, and also a referral.  No pain. No vaginal irritation or discharge except for bleeding. She does smoke socially, but after discussing she is agreeable with stopping as this elevates her risk of getting a blood clot while she is taking birth control pills.  Denies pregnancy. Reports that she's had very irregular long-lasting similar menstrual cycles for several months.  No chest pain, no shows of breath, no lightheadedness or weakness.  History reviewed. No pertinent past medical history.  There are no active problems to display for this patient.   History reviewed. No pertinent surgical history.  Prior to Admission medications   Medication Sig Start Date End Date Taking? Authorizing Provider  norgestrel-ethinyl estradiol (LO/OVRAL,CRYSELLE) 0.3-30 MG-MCG tablet Take 1 tablet by mouth daily. 06/23/16  Yes Emily Filbert, MD  HYDROcodone-acetaminophen (NORCO/VICODIN) 5-325 MG per tablet Take 1-2 tablets by mouth every 6 (six) hours as needed. Patient not taking: Reported on 07/13/2016 12/18/14    Payton Mccallum, MD  ibuprofen (ADVIL,MOTRIN) 800 MG tablet Take 1 tablet (800 mg total) by mouth every 8 (eight) hours as needed. Patient not taking: Reported on 07/13/2016 05/18/15   Joni Reining, PA-C  metroNIDAZOLE (FLAGYL) 500 MG tablet Take 1 tablet (500 mg total) by mouth 2 (two) times daily. 07/13/16   Sharyn Creamer, MD  Only taking Cryselle  Allergies Patient has no known allergies.  Family History  Problem Relation Age of Onset  . Lupus Mother     Social History Social History  Substance Use Topics  . Smoking status: Light Tobacco Smoker  . Smokeless tobacco: Never Used  . Alcohol use Yes    Review of Systems Constitutional: No fever/chills Eyes: No visual changes. ENT: No sore throat. Cardiovascular: Denies chest pain. Respiratory: Denies shortness of breath. Gastrointestinal: No abdominal pain.  No nausea, no vomiting.  No diarrhea.  No constipation. Genitourinary: Negative for dysuria. Musculoskeletal: Negative for back pain. Skin: Negative for rash. Neurological: Negative for headaches, focal weakness or numbness.  10-point ROS otherwise negative.  ____________________________________________   PHYSICAL EXAM:  VITAL SIGNS: ED Triage Vitals  Enc Vitals Group     BP 07/13/16 1113 (!) 144/80     Pulse Rate 07/13/16 1113 89     Resp 07/13/16 1113 16     Temp 07/13/16 1113 98 F (36.7 C)     Temp Source 07/13/16 1113 Oral     SpO2 07/13/16 1113 96 %     Weight 07/13/16 1115 200 lb (90.7 kg)     Height 07/13/16 1115 5\' 7"  (1.702 m)     Head Circumference --  Peak Flow --      Pain Score 07/13/16 1115 7     Pain Loc --      Pain Edu? --      Excl. in GC? --     Constitutional: Alert and oriented. Well appearing and in no acute distress. Eyes: Conjunctivae are normal.  Head: Atraumatic. Nose: No congestion/rhinnorhea. Mouth/Throat: Mucous membranes are moist.   Neck: No stridor.   Cardiovascular: Normal rate, regular rhythm. Grossly normal heart  sounds. Respiratory: Normal respiratory effort.   Gastrointestinal: Soft and nontender. No distention.  Offered to perform a pelvic exam, patient reports she does not wish for a pelvic exam but would be willing to give samples such as self swabbing for a wet prep and a urine test for gonorrhea chlamydia. Exam was not performed, though offered, I think it is reasonable given her recent workup ultrasound and symptomatology that the patient could obtain samples which we will send without having to have a pelvic exam performed immediately today. Musculoskeletal: No lower extremity tenderness nor edema.   Neurologic:  Normal speech and language. No gross focal neurologic deficits are appreciated.  Skin:  Skin is warm, dry and intact. No rash noted. Psychiatric: Mood and affect are normal. Speech and behavior are normal.  ____________________________________________   LABS (all labs ordered are listed, but only abnormal results are displayed)  Labs Reviewed  WET PREP, GENITAL - Abnormal; Notable for the following:       Result Value   Clue Cells Wet Prep HPF POC PRESENT (*)    WBC, Wet Prep HPF POC FEW (*)    All other components within normal limits  BASIC METABOLIC PANEL - Abnormal; Notable for the following:    Glucose, Bld 102 (*)    All other components within normal limits  CHLAMYDIA/NGC RT PCR ([facility] ONLY)  HCG, QUANTITATIVE, PREGNANCY  CBC  TYPE AND SCREEN   ____________________________________________  EKG   ____________________________________________  RADIOLOGY  Previous pelvic ultrasound from February 9 reviewed by me. ____________________________________________   PROCEDURES  Procedure(s) performed: None  Procedures  Critical Care performed: No  ____________________________________________   INITIAL IMPRESSION / ASSESSMENT AND PLAN / ED COURSE  Pertinent labs & imaging results that were available during my care of the patient were reviewed by me  and considered in my medical decision making (see chart for details).  Ongoing vaginal bleeding. Normal hemoglobin, currently on Cryselle and iron supplements. No symptoms of acute anemia, using 2-3 pads today. She is in no acute distress, and hemodynamically stable.  Suspect dysfunctional uterine bleeding, will send wet prep and GC though I find the likelihood of acute infection to be very low given her clinical symptomatology.  Discussed case with Dr. Dalbert GarnetBeasley  who recommends outpatient follow-up, continue current birth control, continue iron supplementation, and took her number and will call her to set up close follow-up appointment. He [facility]  Return precautions and treatment recommendations and follow-up discussed with the patient who is agreeable with the plan.    ____________________________________________   FINAL CLINICAL IMPRESSION(S) / ED DIAGNOSES  Final diagnoses:  BV (bacterial vaginosis)  Dysfunctional uterine bleeding      NEW MEDICATIONS STARTED DURING THIS VISIT:  New Prescriptions   METRONIDAZOLE (FLAGYL) 500 MG TABLET    Take 1 tablet (500 mg total) by mouth 2 (two) times daily.     Note:  This document was prepared using Dragon voice recognition software and may include unintentional dictation  errors.     Sharyn Creamer, MD 07/13/16 [phone]

## 2017-03-26 IMAGING — US US TRANSVAGINAL NON-OB
1 series · 14 of 25 positions shown · non-contrast
Comparison: None

CLINICAL DATA: Vaginal bleeding since 05/10/2016.

EXAM:
TRANSABDOMINAL AND TRANSVAGINAL ULTRASOUND OF PELVIS
TECHNIQUE: Both transabdominal and transvaginal ultrasound examinations of the
pelvis were performed. Transabdominal technique was performed for
global imaging of the pelvis including uterus, ovaries, adnexal
regions, and pelvic cul-de-sac. It was necessary to proceed with
endovaginal exam following the transabdominal exam to visualize the
endometrium and ovaries.

[Series 1: us transvaginal non-ob · 0.27mm/px · 14 of 75 slices shown]
[im 1/75]
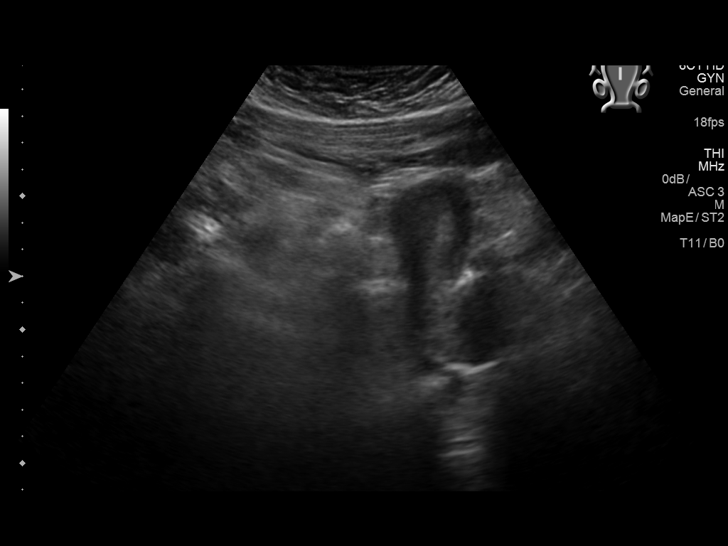
[im 7/75]
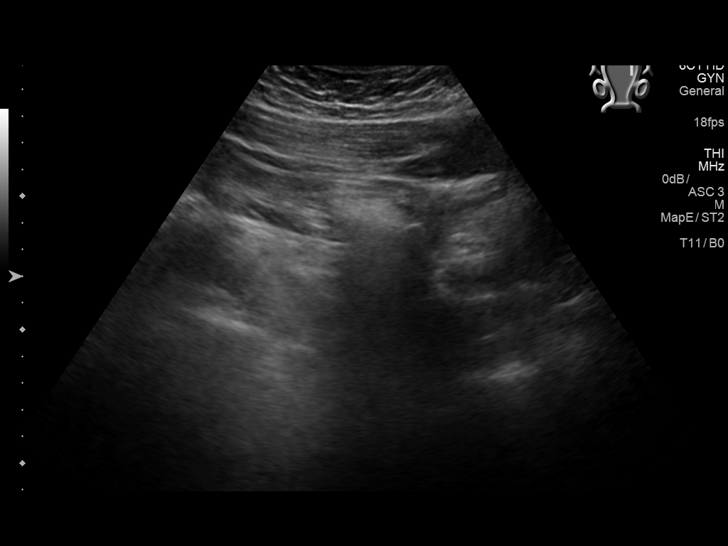
[im 13/75]
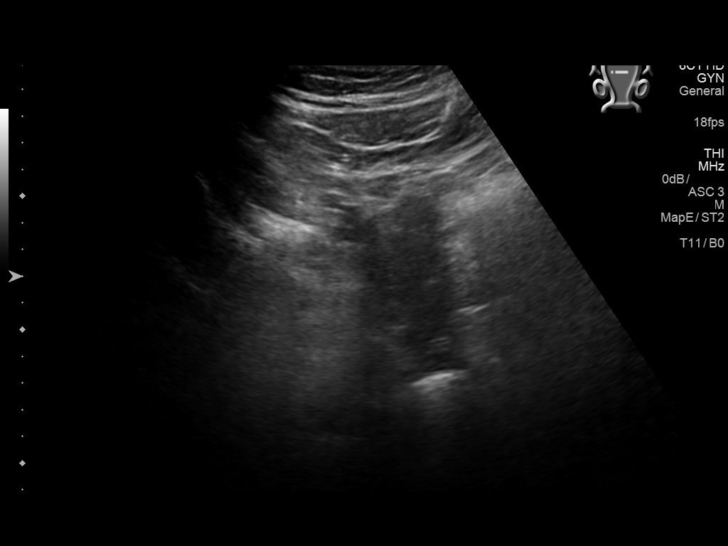
[im 19/75]
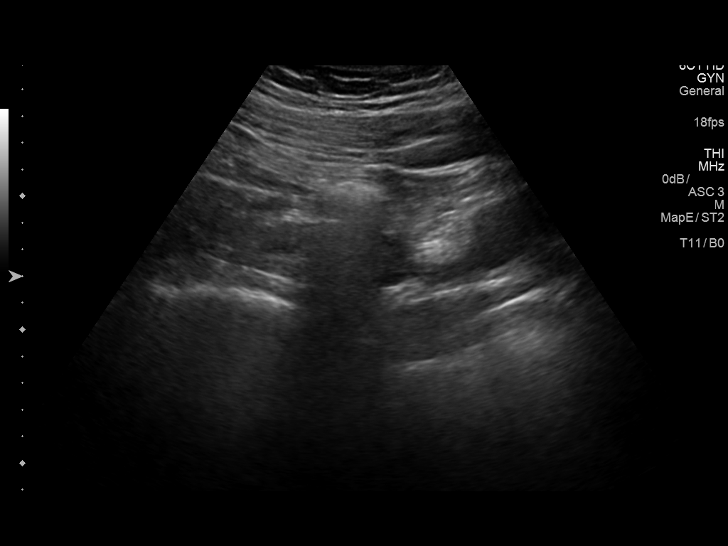
[im 25/75]
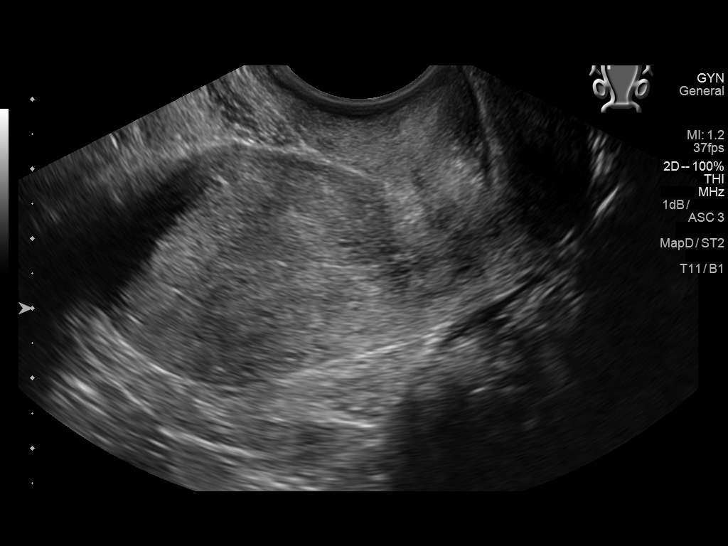
[im 28/75]
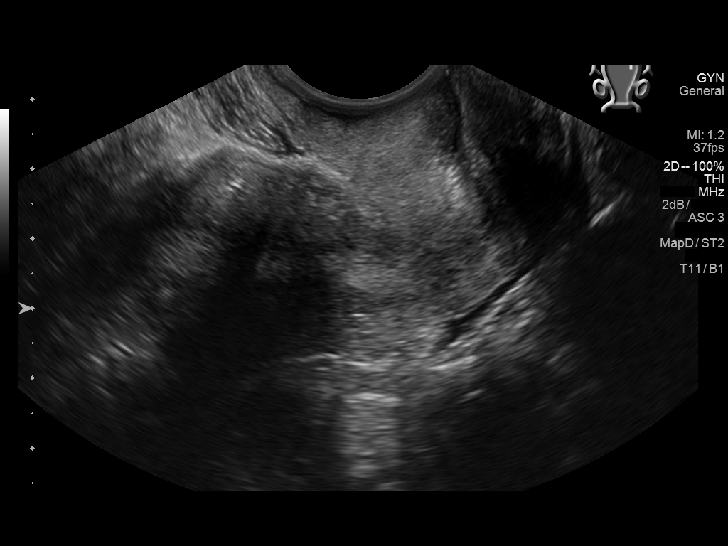
[im 34/75]
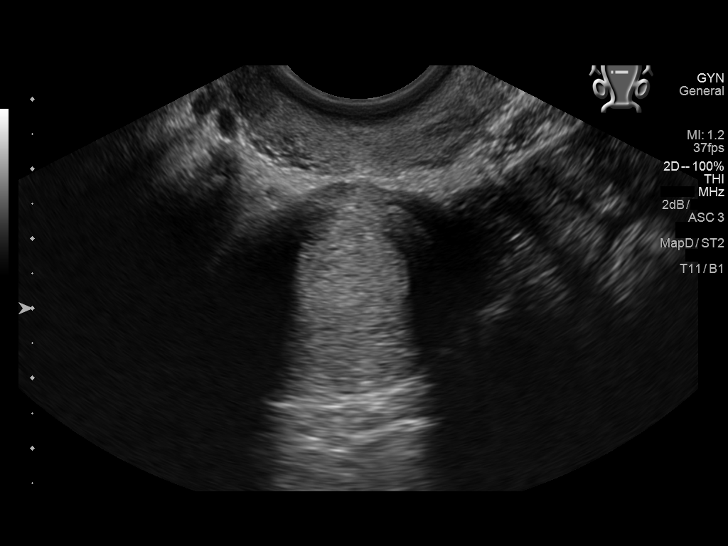
[im 41/75]
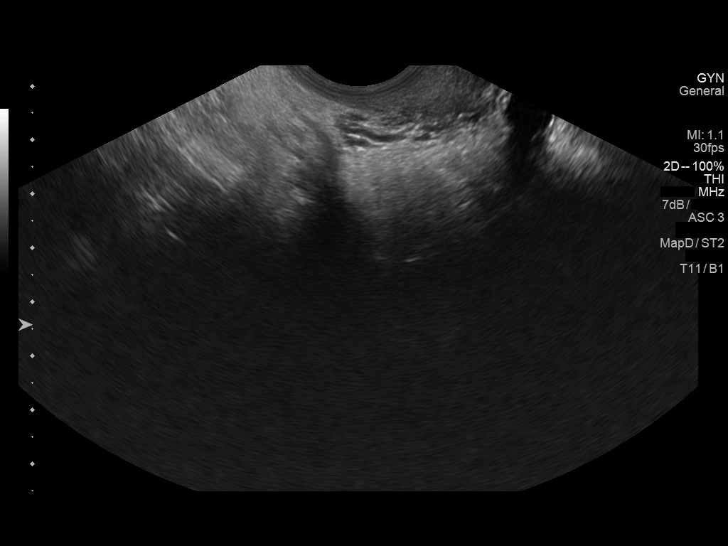
[im 47/75]
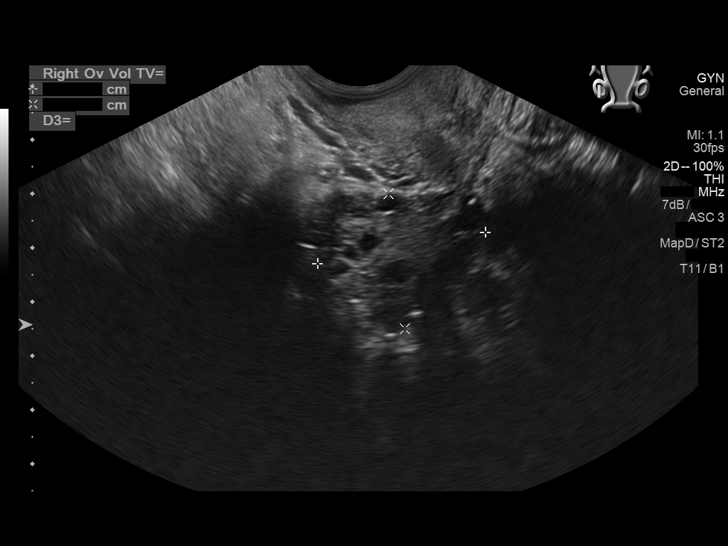
[im 50/75]
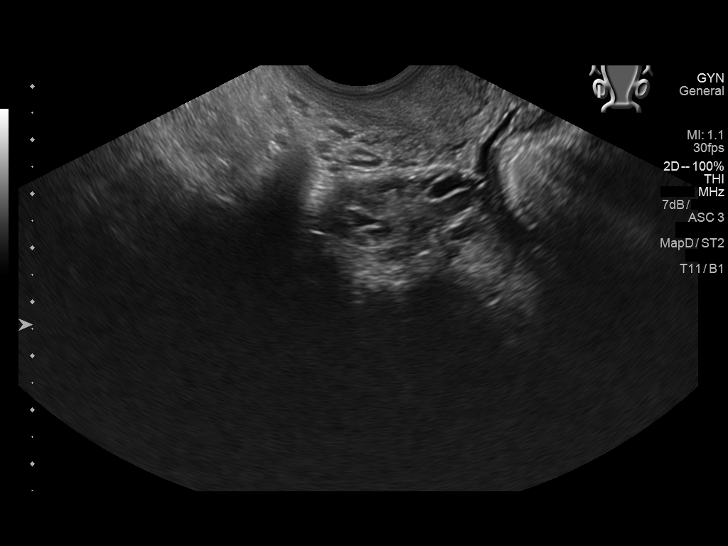
[im 56/75]
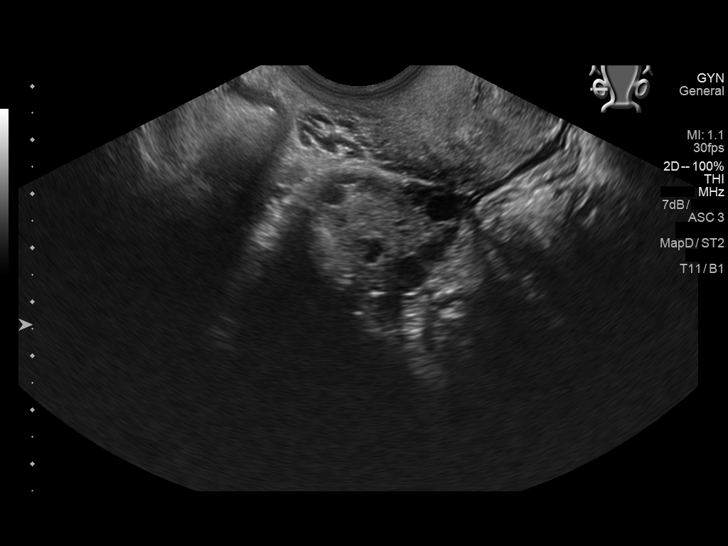
[im 62/75]
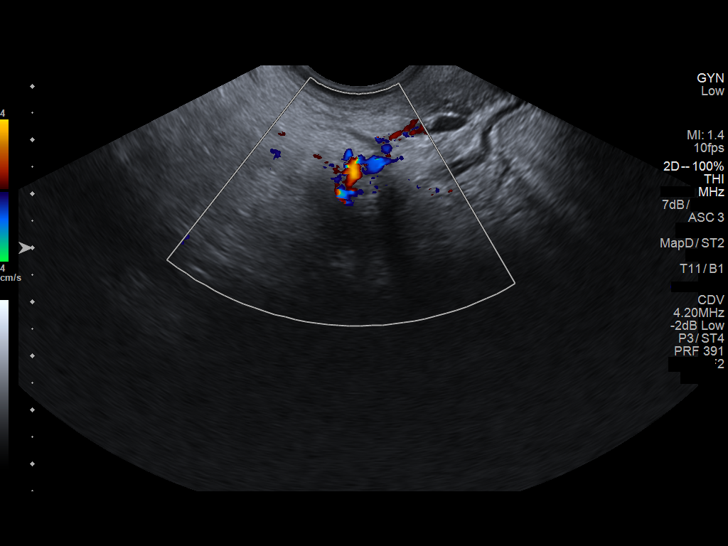
[im 68/75]
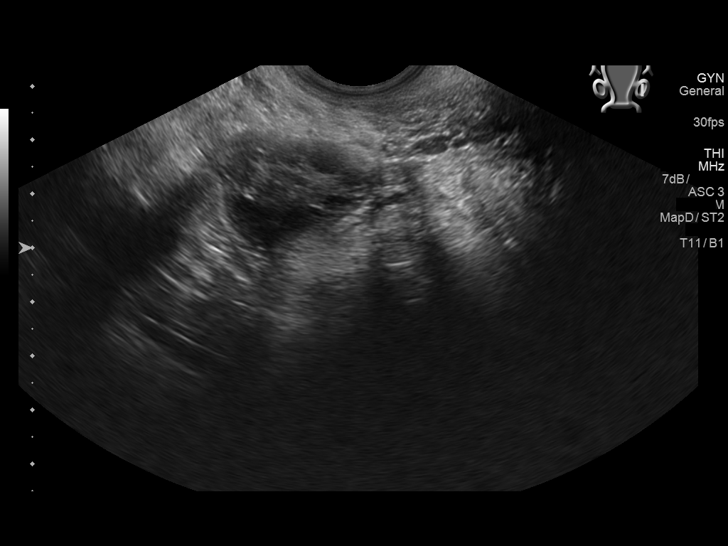
[im 75/75]
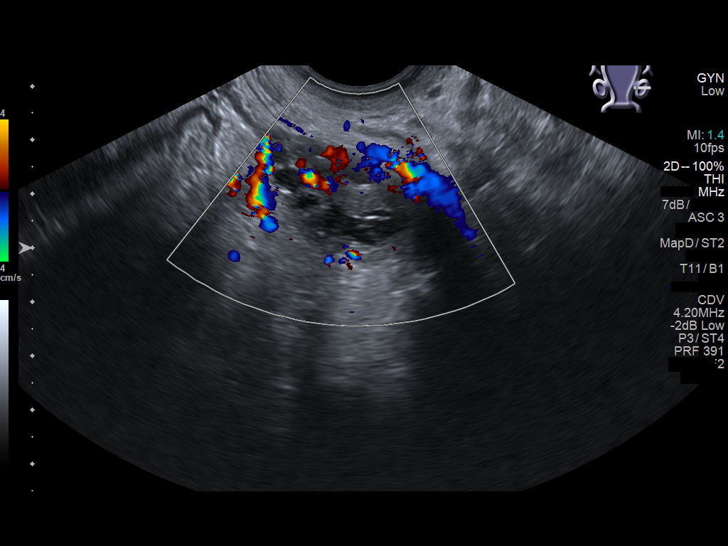

[14 of 25 positions shown; findings below may reference images not displayed]

FINDINGS: Uterus

Measurements: 7.4 x 3.2 x 3.7 cm. No fibroids or other mass
visualized.

Endometrium

Thickness: 1.5 cm.  No focal abnormality visualized.

Right ovary

Measurements: 3.2 x 2.5 x 3.0 cm. Normal appearance/no adnexal mass.

Left ovary

Measurements: 3.4 x 2.0 x 3.0 cm. Normal appearance/no adnexal mass.

Other findings

Trace amount of free pelvic fluid noted.
IMPRESSION: No acute or focal abnormality is identified. Endometrial stripe
thickness is 15 mm. Upper limit of normal for a premenopausal
patient with abnormal uterine bleeding is 16 mm.

## 2018-11-29 ENCOUNTER — Encounter: Payer: Self-pay | Admitting: Obstetrics and Gynecology

## 2019-02-06 ENCOUNTER — Other Ambulatory Visit (HOSPITAL_COMMUNITY)
Admission: RE | Admit: 2019-02-06 | Discharge: 2019-02-06 | Disposition: A | Discharge status: Home / Self Care | Payer: Managed Care, Other (non HMO) | Source: Ambulatory Visit | Attending: Obstetrics and Gynecology | Admitting: Obstetrics and Gynecology

## 2019-02-06 ENCOUNTER — Other Ambulatory Visit: Payer: Self-pay | Admitting: Obstetrics and Gynecology

## 2019-02-06 DIAGNOSIS — Z124 Encounter for screening for malignant neoplasm of cervix: Secondary | ICD-10-CM | POA: Diagnosis not present

## 2019-02-12 LAB — CYTOLOGY - PAP
Chlamydia: NEGATIVE
Diagnosis: NEGATIVE
Molecular Disclaimer: NEGATIVE
Molecular Disclaimer: NORMAL
Neisseria Gonorrhea: NEGATIVE

## 2024-05-30 ENCOUNTER — Ambulatory Visit: Admitting: "Endocrinology

## 2024-06-04 ENCOUNTER — Ambulatory Visit: Admitting: "Endocrinology
